# Patient Record
Sex: Male | Born: 1990 | Hispanic: No | Marital: Married
Health system: Southern US, Community
[De-identification: ages and names within clinical notes are randomized; demographics above are authoritative.]

---

## 2019-04-09 ENCOUNTER — Other Ambulatory Visit: Payer: Self-pay

## 2019-04-09 ENCOUNTER — Emergency Department (HOSPITAL_COMMUNITY): Payer: Self-pay

## 2019-04-09 ENCOUNTER — Encounter (HOSPITAL_COMMUNITY): Payer: Self-pay

## 2019-04-09 ENCOUNTER — Emergency Department (HOSPITAL_COMMUNITY)
Admission: EM | Admit: 2019-04-09 | Discharge: 2019-04-09 | Disposition: A | Discharge status: Home / Self Care | Payer: Self-pay | Attending: Emergency Medicine | Admitting: Emergency Medicine

## 2019-04-09 DIAGNOSIS — S91331A Puncture wound without foreign body, right foot, initial encounter: Secondary | ICD-10-CM

## 2019-04-09 DIAGNOSIS — W450XXA Nail entering through skin, initial encounter: Secondary | ICD-10-CM | POA: Insufficient documentation

## 2019-04-09 DIAGNOSIS — Y999 Unspecified external cause status: Secondary | ICD-10-CM | POA: Insufficient documentation

## 2019-04-09 DIAGNOSIS — Y9389 Activity, other specified: Secondary | ICD-10-CM | POA: Insufficient documentation

## 2019-04-09 DIAGNOSIS — Y929 Unspecified place or not applicable: Secondary | ICD-10-CM | POA: Insufficient documentation

## 2019-04-09 DIAGNOSIS — Z23 Encounter for immunization: Secondary | ICD-10-CM | POA: Insufficient documentation

## 2019-04-09 MED ORDER — CEPHALEXIN 500 MG PO CAPS: 500.0000 mg | ORAL_CAPSULE | Freq: Three times a day (TID) | ORAL | 0 refills | Status: AC

## 2019-04-09 MED ORDER — IBUPROFEN 200 MG PO TABS
600.0000 mg | ORAL_TABLET | Freq: Once | ORAL | Status: AC
  Administered 2019-04-09: 600 mg via ORAL
  Filled 2019-04-09: qty 3

## 2019-04-09 MED ORDER — CIPROFLOXACIN HCL 500 MG PO TABS: 500.0000 mg | ORAL_TABLET | Freq: Two times a day (BID) | ORAL | 0 refills | Status: AC

## 2019-04-09 MED ORDER — TETANUS-DIPHTH-ACELL PERTUSSIS 5-2.5-18.5 LF-MCG/0.5 IM SUSP
0.5000 mL | Freq: Once | INTRAMUSCULAR | Status: AC
  Administered 2019-04-09: 0.5 mL via INTRAMUSCULAR
  Filled 2019-04-09: qty 0.5

## 2019-04-09 NOTE — ED Triage Notes (Signed)
Pt arrived stating this morning he was walking and stepped on large nail that went through his right foot. Pt brought nail into department and pictures. Bleeding is controlled, some swelling noted to the foot. Pt unsure when his last tetanus shot was.

## 2019-04-09 NOTE — ED Provider Notes (Signed)
[facility]-EMERGENCY DEPT Provider Note   CSN: [id number] Arrival date & time: 04/09/19  1955     History   Chief Complaint Chief Complaint  Patient presents with  . Puncture Wound    Right foot    HPI Jeffery Serrano is a 28 y.o. male.     HPI   28 year old male with puncture wound to his right foot.  Patient stepped in a large nail earlier today.  They went through a flip-flop and into his foot.  He is unsure of his last tetanus.  No other acute complaints.  History reviewed. No pertinent past medical history.  There are no active problems to display for this patient.        Home Medications    Prior to Admission medications   Medication Sig Start Date End Date Taking? Authorizing Provider  cephALEXin (KEFLEX) 500 MG capsule Take 1 capsule (500 mg total) by mouth 3 (three) times daily. 04/09/19   Raeford RazorKohut, Tarry Fountain, MD  ciprofloxacin (CIPRO) 500 MG tablet Take 1 tablet (500 mg total) by mouth 2 (two) times daily. 04/09/19   Raeford RazorKohut, Narcissus Detwiler, MD    Family History No family history on file.  Social History Social History   Tobacco Use  . Smoking status: Not on file  Substance Use Topics  . Alcohol use: Not on file  . Drug use: Not on file     Allergies   Patient has no known allergies.   Review of Systems Review of Systems All systems reviewed and negative, other than as noted in HPI.   Physical Exam Updated Vital Signs BP 129/90 (BP Location: Left Arm)   Pulse 98   Temp 98.2 F (36.8 C) (Oral)   Resp 18   Ht 5\' 5"  (1.651 m)   Wt 61.2 kg   SpO2 100%   BMI 22.47 kg/m   Physical Exam Vitals signs and nursing note reviewed.  Constitutional:      General: He is not in acute distress.    Appearance: He is well-developed.  HENT:     Head: Normocephalic and atraumatic.  Eyes:     General:        Right eye: No discharge.        Left eye: No discharge.     Conjunctiva/sclera: Conjunctivae normal.  Neck:     Musculoskeletal: Neck  supple.  Cardiovascular:     Rate and Rhythm: Normal rate and regular rhythm.     Heart sounds: Normal heart sounds. No murmur. No friction rub. No gallop.   Pulmonary:     Effort: Pulmonary effort is normal. No respiratory distress.     Breath sounds: Normal breath sounds.  Abdominal:     General: There is no distension.     Palpations: Abdomen is soft.     Tenderness: There is no abdominal tenderness.  Musculoskeletal:        General: No tenderness.     Comments: Small wound consistent with a puncture wound to the plantar aspect of the right foot towards the heel.  No active bleeding.  No obvious foreign body.  Minimal soft tissue swelling.  Rest intact.  Skin:    General: Skin is warm and dry.  Neurological:     Mental Status: He is alert.  Psychiatric:        Behavior: Behavior normal.        Thought Content: Thought content normal.      ED Treatments / Results  Labs (  all labs ordered are listed, but only abnormal results are displayed) Labs Reviewed - No data to display  EKG    Radiology Dg Foot Complete Right  Result Date: 04/09/2019 CLINICAL DATA:  Puncture wound with metal spike on plantar surface near the heel. EXAM: RIGHT FOOT COMPLETE - 3+ VIEW COMPARISON:  None. FINDINGS: There is no evidence of fracture or dislocation. There is no evidence of arthropathy or other focal bone abnormality. Soft tissues are unremarkable. IMPRESSION: Negative. Electronically Signed   By: Constance Holster M.D.   On: 04/09/2019 20:57    Procedures Procedures (including critical care time)  Medications Ordered in ED Medications  Tdap (BOOSTRIX) injection 0.5 mL (0.5 mLs Intramuscular Given 04/09/19 2052)  ibuprofen (ADVIL) tablet 600 mg (600 mg Oral Given 04/09/19 2051)     Initial Impression / Assessment and Plan / ED Course  I have reviewed the triage vital signs and the nursing notes.  Pertinent labs & imaging results that were available during my care of the patient were  reviewed by me and considered in my medical decision making (see chart for details).       28 year old male with a puncture wound to his right foot.  Wound care.  Tetanus updated.  Negative imaging.  Nail went through sole of flip-flop.  Antibiotics with coverage for Pseudomonas.  Final Clinical Impressions(s) / ED Diagnoses   Final diagnoses:  Puncture wound of right foot, initial encounter    ED Discharge Orders         Ordered    ciprofloxacin (CIPRO) 500 MG tablet  2 times daily     04/09/19 2101    cephALEXin (KEFLEX) 500 MG capsule  3 times daily     04/09/19 2101           Virgel Manifold, MD 04/09/19 2239

## 2020-07-23 IMAGING — DX RIGHT FOOT COMPLETE - 3+ VIEW
3 series · 3 of 3 positions shown · non-contrast
Comparison: None.

CLINICAL DATA: Puncture wound with metal spike on plantar surface
near the heel.

EXAM:
RIGHT FOOT COMPLETE - 3+ VIEW

[foot ap]
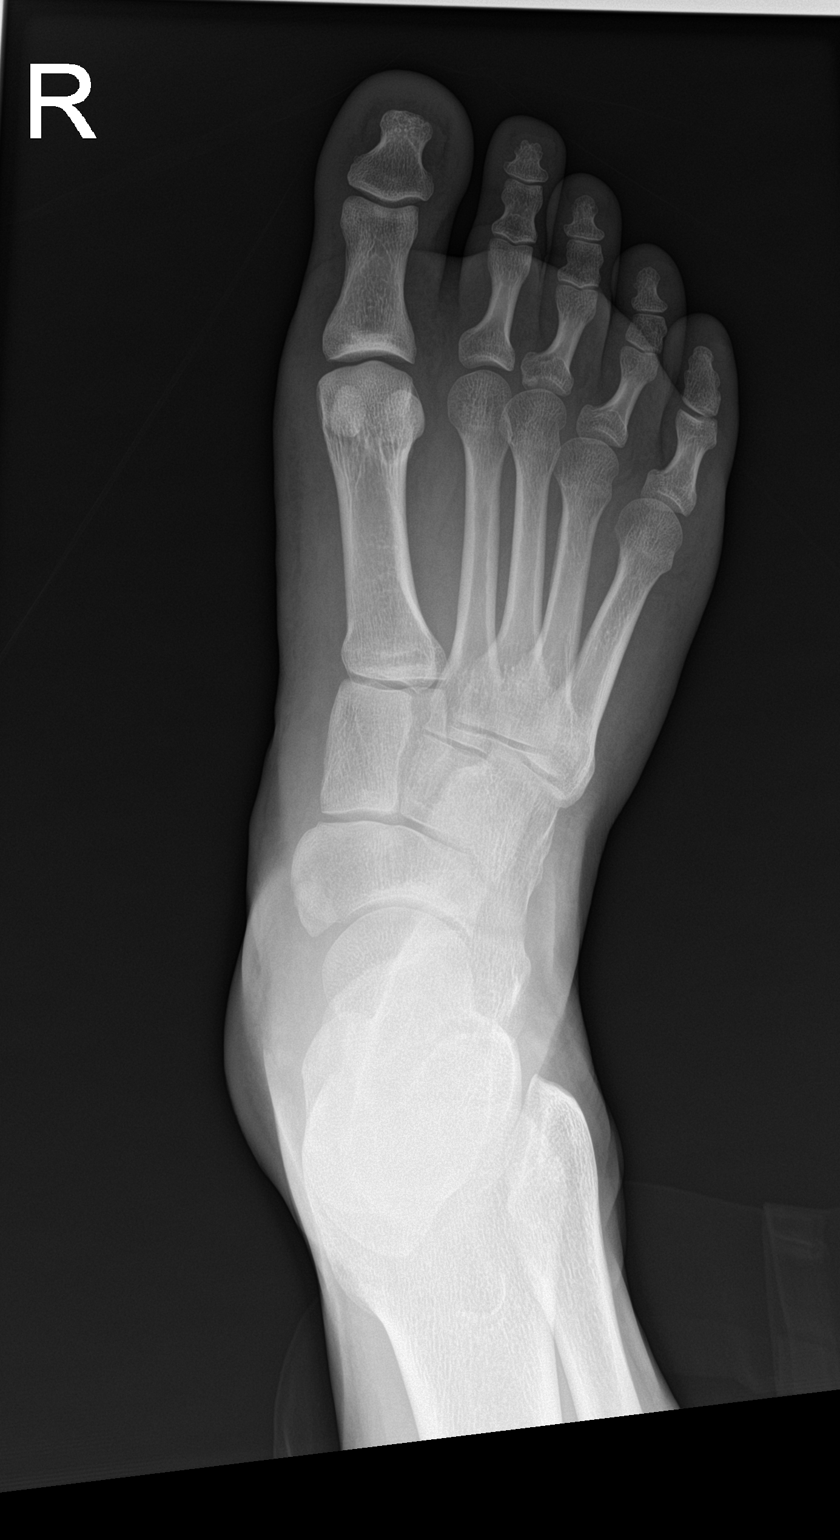

[foot obl]
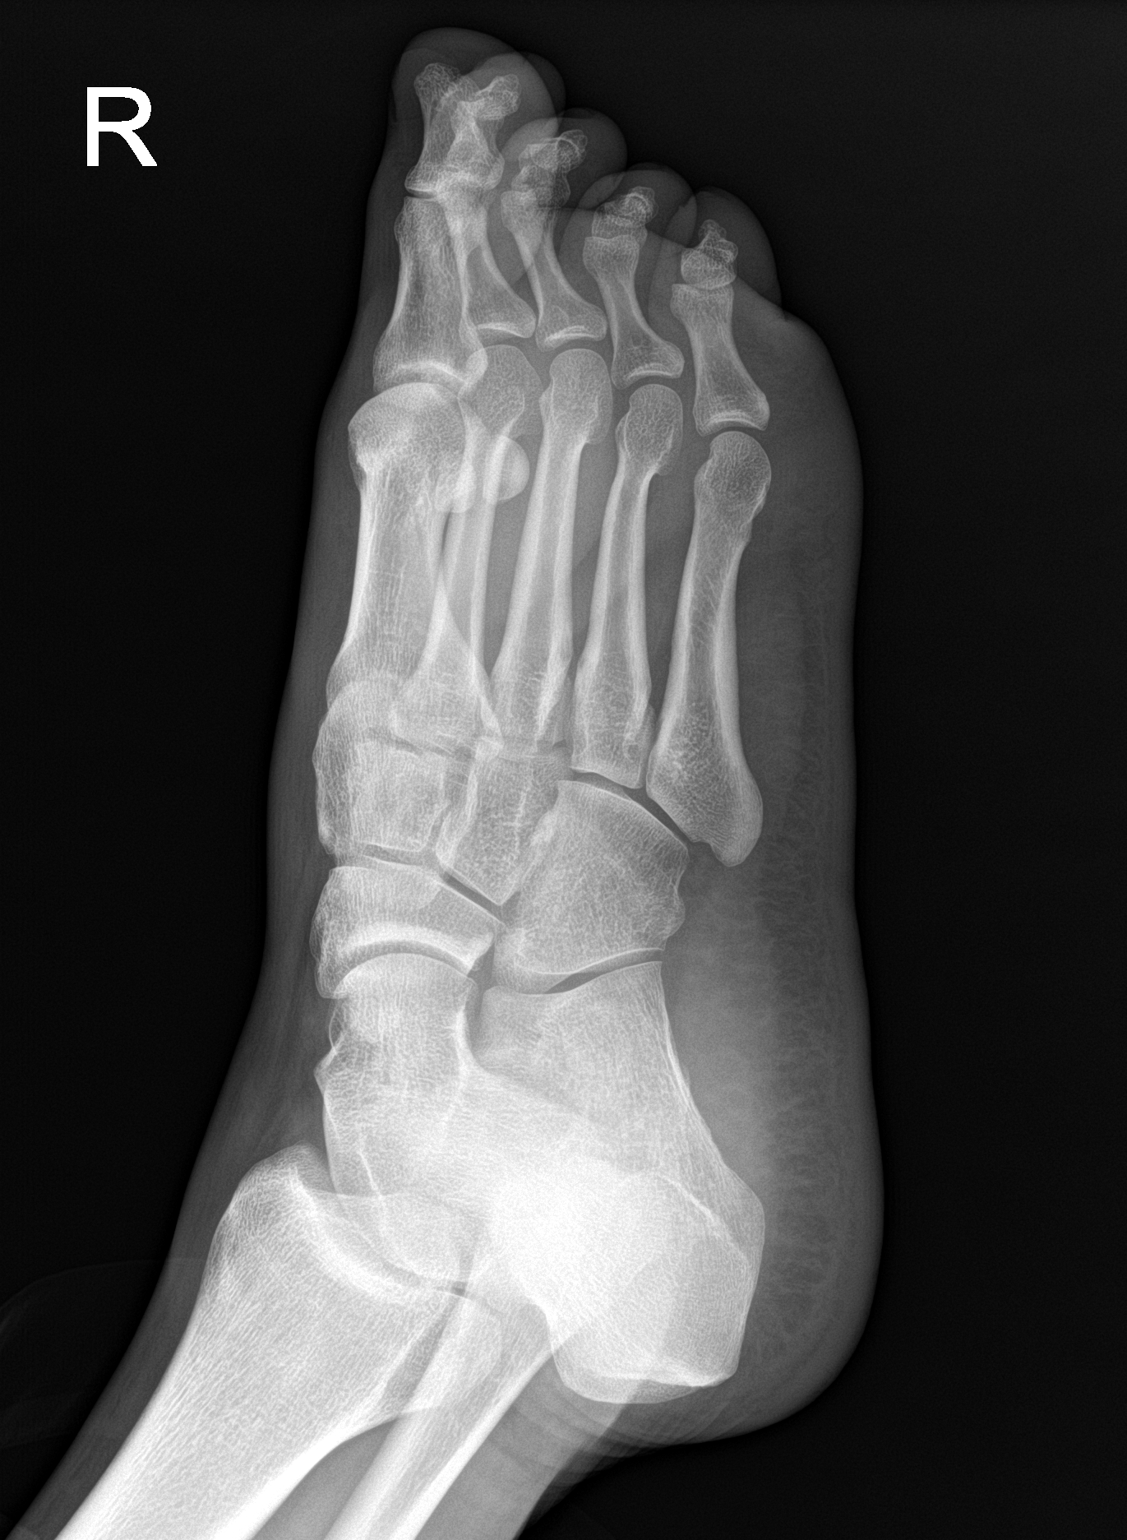

[foot lat]
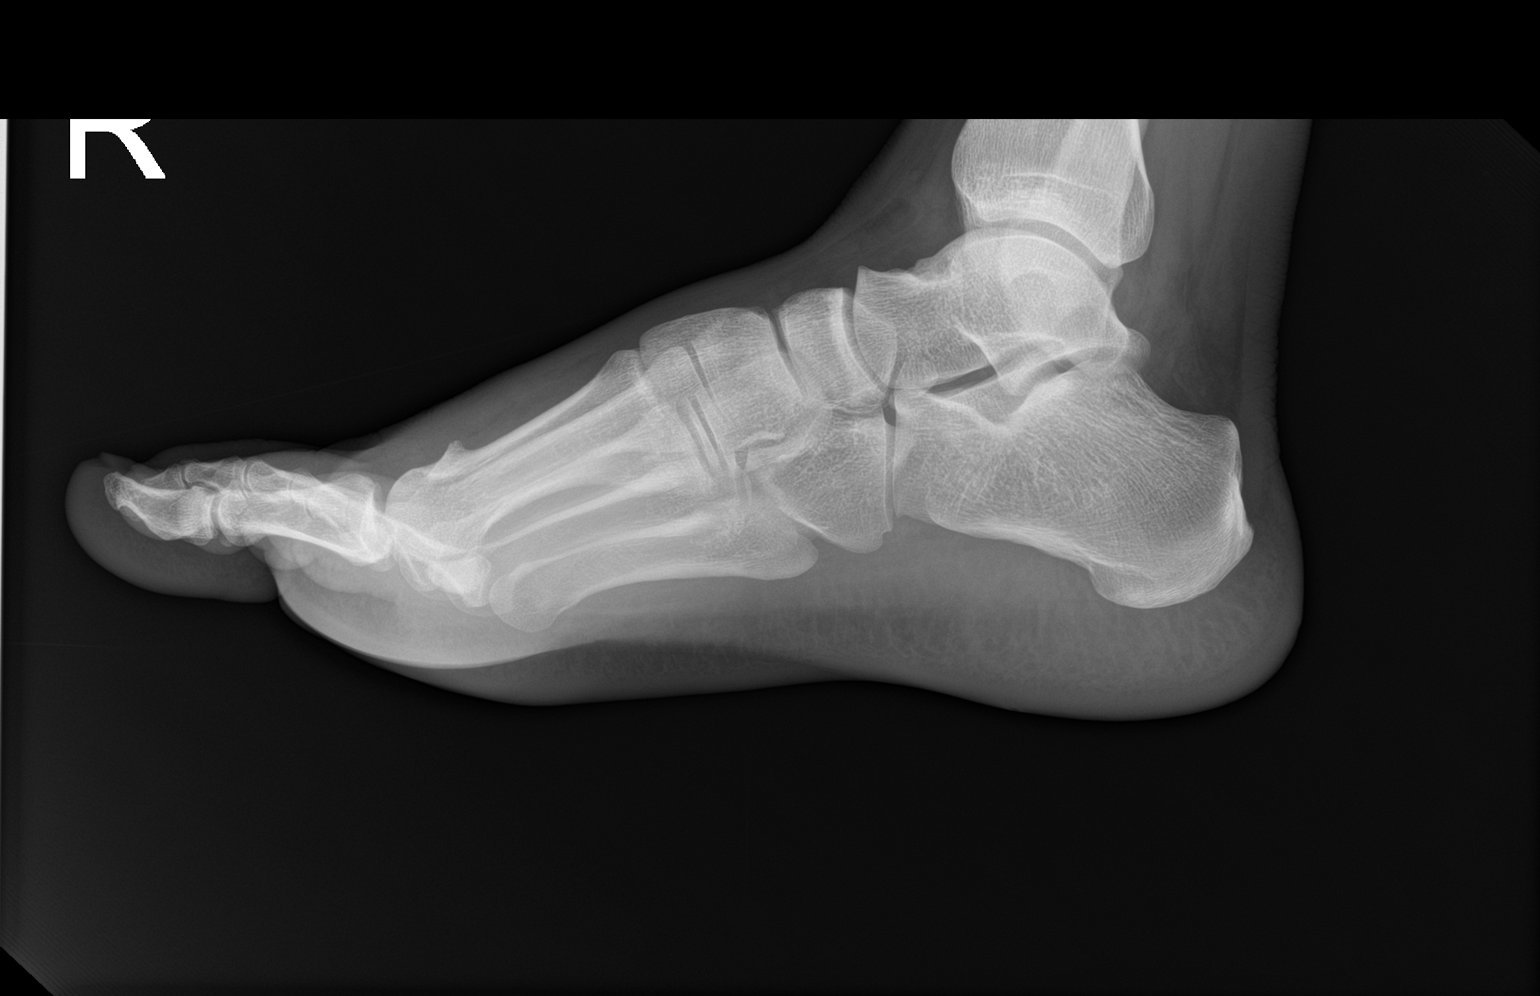

[3 of 3 positions shown; findings below may reference images not displayed]

FINDINGS: There is no evidence of fracture or dislocation. There is no
evidence of arthropathy or other focal bone abnormality. Soft
tissues are unremarkable.
IMPRESSION: Negative.
